# Patient Record
Sex: Male | Born: 2000 | Race: Black or African American | Hispanic: No | Marital: Single | Smoking: Never smoker
Health system: Southern US, Community
[De-identification: ages and names within clinical notes are randomized; demographics above are authoritative.]

## PROBLEM LIST (undated history)

## (undated) DIAGNOSIS — J45909 Unspecified asthma, uncomplicated: Secondary | ICD-10-CM

## (undated) DIAGNOSIS — T7840XA Allergy, unspecified, initial encounter: Secondary | ICD-10-CM

## (undated) DIAGNOSIS — F909 Attention-deficit hyperactivity disorder, unspecified type: Secondary | ICD-10-CM

## (undated) HISTORY — PX: CIRCUMCISION: SUR203

---
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
## 2022-01-12 ENCOUNTER — Encounter (HOSPITAL_COMMUNITY): Payer: Self-pay

## 2022-01-12 ENCOUNTER — Other Ambulatory Visit: Payer: Self-pay

## 2022-01-12 ENCOUNTER — Emergency Department (HOSPITAL_COMMUNITY)
Admission: EM | Admit: 2022-01-12 | Discharge: 2022-01-12 | Disposition: A | Discharge status: Home / Self Care | Payer: 59 | Attending: Student | Admitting: Student

## 2022-01-12 ENCOUNTER — Ambulatory Visit: Admission: EM | Admit: 2022-01-12 | Discharge: 2022-01-12 | Discharge status: Absent without leave | Payer: Self-pay

## 2022-01-12 DIAGNOSIS — M795 Residual foreign body in soft tissue: Secondary | ICD-10-CM | POA: Diagnosis not present

## 2022-01-12 DIAGNOSIS — S60551A Superficial foreign body of right hand, initial encounter: Secondary | ICD-10-CM | POA: Insufficient documentation

## 2022-01-12 DIAGNOSIS — W458XXA Other foreign body or object entering through skin, initial encounter: Secondary | ICD-10-CM | POA: Insufficient documentation

## 2022-01-12 MED ORDER — CEPHALEXIN 500 MG PO CAPS: 500.0000 mg | ORAL_CAPSULE | Freq: Two times a day (BID) | ORAL | 0 refills | Status: AC

## 2022-01-12 MED ORDER — LIDOCAINE HCL (PF) 1 % IJ SOLN: 5.0000 mL | Freq: Once | INTRAMUSCULAR | Status: DC

## 2022-01-12 MED ORDER — LIDOCAINE HCL (PF) 1 % IJ SOLN
INTRAMUSCULAR | Status: AC
  Filled 2022-01-12: qty 5

## 2022-01-12 NOTE — ED Triage Notes (Signed)
Pt arrives with c/o a splinter being lodged into his right hand for about 2 hours.

## 2022-01-12 NOTE — Discharge Instructions (Signed)
Please pick up antibiotics and take as prescribed to cover for infection  While at home you can soak you hand in warm water/epsom salts to soften up any possible remainder of the bamboo  Follow up with your PCP for further eval  Return to the ED for any signs of infection including redness/swelling around the wound, drainage of pus, fevers > 100.4

## 2022-01-12 NOTE — ED Provider Notes (Signed)
[facility] EMERGENCY DEPARTMENT Provider Note   CSN: [id number] Arrival date & time: 01/12/22  1704     History  Chief Complaint  Patient presents with   Foreign Body in Skin    [name] is a 21 y.o. male who presents to the ED today for foreign body and right hand.  He states that he was playing around with his friend and trying to climb up bamboo sticks.  Patient states the bamboo stick fell while he was climbing up causing him to injure his right hand in the process.  He feels like he has a piece of bamboo stuck in his hand at this time.  He does have a skin avulsion to his hand as well.  Tetanus is up-to-date.  He has no other complaints.  Denies any head injury or loss of consciousness.  The history is provided by the patient, a parent and medical records.      Home Medications Prior to Admission medications   Medication Sig Start Date End Date Taking? Authorizing Provider  cephALEXin (KEFLEX) 500 MG capsule Take 1 capsule (500 mg total) by mouth 2 (two) times daily for 7 days. 01/12/22 01/19/22 Yes Ralf Konopka, PA-C  oxyCODONE (OXY IR/ROXICODONE) 5 MG immediate release tablet Take 1 tablet (5 mg total) by mouth every 4 (four) hours as needed for severe pain. 10/05/20   Shepperson, Kirstin, PA-C  promethazine (PHENERGAN) 12.5 MG tablet Take 1 tablet (12.5 mg total) by mouth every 8 (eight) hours as needed for nausea or vomiting. 10/05/20   Shepperson, Kirstin, PA-C      Allergies    Citrus    Review of Systems   Review of Systems  Constitutional:  Negative for chills and fever.  Musculoskeletal:  Positive for arthralgias.  Skin:  Positive for wound.  All other systems reviewed and are negative.  Physical Exam Updated Vital Signs BP (!) 146/86 (BP Location: Right Arm)    Pulse (!) 52    Temp 98.4 F (36.9 C) (Oral)    Resp 16    Ht 5\' 8"  (1.727 m)    Wt 75.3 kg    SpO2 100%    BMI 25.24 kg/m  Physical Exam Vitals and nursing note reviewed.   Constitutional:      Appearance: He is not ill-appearing.  HENT:     Head: Normocephalic and atraumatic.  Eyes:     Conjunctiva/sclera: Conjunctivae normal.  Cardiovascular:     Rate and Rhythm: Normal rate and regular rhythm.  Pulmonary:     Effort: Pulmonary effort is normal.     Breath sounds: Normal breath sounds.  Musculoskeletal:     Comments: Skin avulsion noted to R hand along 5th MCP joint. FB palpated in this area measuring approximately 1 cm. 2+ Radial pulse.   Skin:    General: Skin is warm and dry.     Coloration: Skin is not jaundiced.  Neurological:     Mental Status: He is alert.    ED Results / Procedures / Treatments   Labs (all labs ordered are listed, but only abnormal results are displayed) Labs Reviewed - No data to display  EKG None  Radiology No results found.  Procedures .Foreign Body Removal  Date/Time: 01/12/2022 7:18 PM Performed by: Eustaquio Maize, PA-C Authorized by: Eustaquio Maize, PA-C  Consent: Verbal consent obtained. Body area: skin General location: upper extremity Location details: right hand Anesthesia: local infiltration  Anesthesia: Local Anesthetic: lidocaine 1% without epinephrine Complexity: complex Number  of foreign bodies recovered: multiple small pieces of wood. Post-procedure assessment: foreign body removed Patient tolerance: patient tolerated the procedure well with no immediate complications Comments: Suspect most of FB removed at this time. Pt covered with keflex incase small piece of wood still remains. Pt instructed on warm water soaks and PCP follow up for further eval.      Medications Ordered in ED Medications  lidocaine (PF) (XYLOCAINE) 1 % injection 5 mL (5 mLs Infiltration Not Given 01/12/22 1844)  lidocaine (PF) (XYLOCAINE) 1 % injection (  Given 01/12/22 1843)    ED Course/ Medical Decision Making/ A&P                           Medical Decision Making 21 year old male who presents to the ED  today for foreign body to right hand, but with a piece of bamboo stuck in his skin while climbing up 1 earlier today.  On arrival he is a skin avulsion noted to his right hand along the fifth MCP joint.  Foreign body palpated.  It has been removed at this time and small multiple pieces of wood splinters.  No obvious remainder palpated at this time however wil empirically cover with antibiotics for potential infection.  Patient instructed on some salt water soaks and PCP follow-up.  He is instructed to return to the ED for any signs of infection.  He is agreement with plan and stable for discharge.  Problems Addressed: Foreign body (FB) in soft tissue: acute illness or injury  Risk Prescription drug management.          Final Clinical Impression(s) / ED Diagnoses Final diagnoses:  Foreign body (FB) in soft tissue    Rx / DC Orders ED Discharge Orders          Ordered    cephALEXin (KEFLEX) 500 MG capsule  2 times daily        01/12/22 1903             Discharge Instructions      Please pick up antibiotics and take as prescribed to cover for infection  While at home you can soak you hand in warm water/epsom salts to soften up any possible remainder of the bamboo  Follow up with your PCP for further eval  Return to the ED for any signs of infection including redness/swelling around the wound, drainage of pus, fevers > 100.4       Eustaquio Maize, PA-C 01/12/22 [place], [place], MD 01/13/22 [phone]

## 2022-04-19 ENCOUNTER — Encounter: Payer: Self-pay | Admitting: *Deleted

## 2024-09-04 ENCOUNTER — Encounter: Payer: Self-pay | Admitting: *Deleted
